# Patient Record
Sex: Female | Born: 1955 | Race: Black or African American | Hispanic: No
Health system: Southern US, Community
[De-identification: ages and names within clinical notes are randomized; demographics above are authoritative.]

## PROBLEM LIST (undated history)

## (undated) DIAGNOSIS — M1990 Unspecified osteoarthritis, unspecified site: Secondary | ICD-10-CM

## (undated) DIAGNOSIS — E782 Mixed hyperlipidemia: Secondary | ICD-10-CM

## (undated) DIAGNOSIS — I10 Essential (primary) hypertension: Secondary | ICD-10-CM

## (undated) HISTORY — DX: Unspecified osteoarthritis, unspecified site: M19.90

## (undated) HISTORY — DX: Essential (primary) hypertension: I10

## (undated) HISTORY — DX: Mixed hyperlipidemia: E78.2

---

## 2008-04-18 ENCOUNTER — Ambulatory Visit: Payer: Self-pay | Admitting: Physician Assistant

## 2008-05-07 ENCOUNTER — Ambulatory Visit: Payer: Self-pay | Admitting: Physician Assistant

## 2010-06-30 ENCOUNTER — Ambulatory Visit: Payer: Self-pay | Admitting: Family Medicine

## 2011-07-01 ENCOUNTER — Ambulatory Visit: Payer: Self-pay | Admitting: Family Medicine

## 2012-06-12 ENCOUNTER — Emergency Department: Payer: Self-pay | Admitting: Emergency Medicine

## 2014-09-19 ENCOUNTER — Other Ambulatory Visit (HOSPITAL_COMMUNITY)
Admission: RE | Admit: 2014-09-19 | Discharge: 2014-09-19 | Disposition: A | Discharge status: Home / Self Care | Payer: BC Managed Care – PPO | Source: Ambulatory Visit | Attending: Family Medicine | Admitting: Family Medicine

## 2014-09-19 DIAGNOSIS — Z01411 Encounter for gynecological examination (general) (routine) with abnormal findings: Secondary | ICD-10-CM | POA: Insufficient documentation

## 2014-09-19 DIAGNOSIS — Z1151 Encounter for screening for human papillomavirus (HPV): Secondary | ICD-10-CM | POA: Insufficient documentation

## 2014-09-20 ENCOUNTER — Other Ambulatory Visit: Payer: Self-pay | Admitting: Family Medicine

## 2014-09-24 LAB — CYTOLOGY - PAP

## 2014-10-17 ENCOUNTER — Other Ambulatory Visit (HOSPITAL_COMMUNITY): Payer: Self-pay | Admitting: Family Medicine

## 2014-10-17 DIAGNOSIS — Z1231 Encounter for screening mammogram for malignant neoplasm of breast: Secondary | ICD-10-CM

## 2014-10-24 ENCOUNTER — Ambulatory Visit (HOSPITAL_COMMUNITY): Payer: BC Managed Care – PPO

## 2018-12-08 ENCOUNTER — Other Ambulatory Visit (HOSPITAL_COMMUNITY): Payer: Self-pay | Admitting: Internal Medicine

## 2018-12-08 DIAGNOSIS — Z1231 Encounter for screening mammogram for malignant neoplasm of breast: Secondary | ICD-10-CM

## 2019-08-23 ENCOUNTER — Other Ambulatory Visit (HOSPITAL_COMMUNITY): Payer: Self-pay | Admitting: Internal Medicine

## 2019-08-23 DIAGNOSIS — Z1231 Encounter for screening mammogram for malignant neoplasm of breast: Secondary | ICD-10-CM

## 2019-08-30 ENCOUNTER — Other Ambulatory Visit: Payer: Self-pay

## 2019-08-30 ENCOUNTER — Ambulatory Visit (HOSPITAL_COMMUNITY)
Admission: RE | Admit: 2019-08-30 | Discharge: 2019-08-30 | Disposition: A | Discharge status: Home / Self Care | Payer: BC Managed Care – PPO | Source: Ambulatory Visit | Attending: Internal Medicine | Admitting: Internal Medicine

## 2019-08-30 DIAGNOSIS — Z1231 Encounter for screening mammogram for malignant neoplasm of breast: Secondary | ICD-10-CM | POA: Insufficient documentation

## 2019-08-31 ENCOUNTER — Other Ambulatory Visit (HOSPITAL_COMMUNITY): Payer: Self-pay | Admitting: Internal Medicine

## 2019-08-31 DIAGNOSIS — Z13820 Encounter for screening for osteoporosis: Secondary | ICD-10-CM

## 2019-09-05 ENCOUNTER — Ambulatory Visit (HOSPITAL_COMMUNITY): Payer: BC Managed Care – PPO

## 2019-12-19 ENCOUNTER — Other Ambulatory Visit: Payer: Self-pay

## 2019-12-19 ENCOUNTER — Ambulatory Visit (HOSPITAL_COMMUNITY)
Admission: RE | Admit: 2019-12-19 | Discharge: 2019-12-19 | Disposition: A | Discharge status: Home / Self Care | Payer: BC Managed Care – PPO | Source: Ambulatory Visit | Attending: Internal Medicine | Admitting: Internal Medicine

## 2019-12-19 DIAGNOSIS — Z13820 Encounter for screening for osteoporosis: Secondary | ICD-10-CM | POA: Diagnosis present

## 2020-09-07 IMAGING — MG DIGITAL SCREENING BILAT W/ TOMO W/ CAD
6 of 10 series · 6 of 30 positions shown · non-contrast
Comparison: Previous exam(s).

CLINICAL DATA: Screening.

EXAM:
DIGITAL SCREENING BILATERAL MAMMOGRAM WITH TOMO AND CAD

[L CC synth-2D]
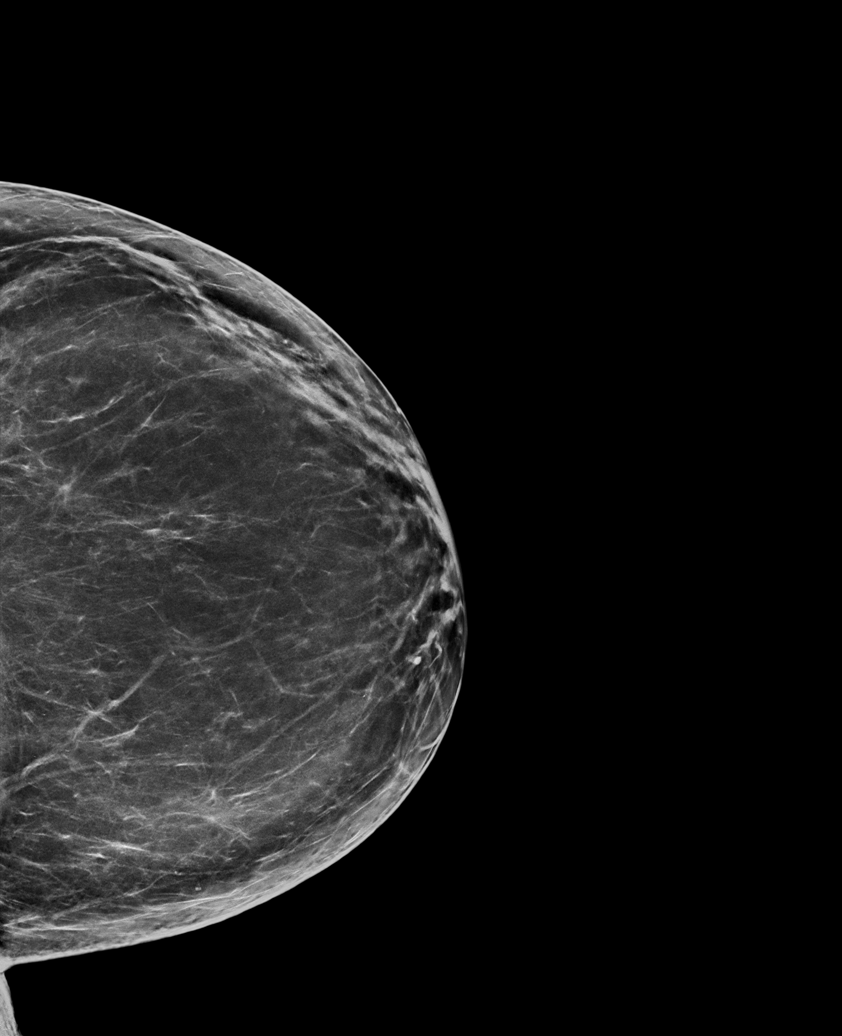

[R MLO synth-2D]
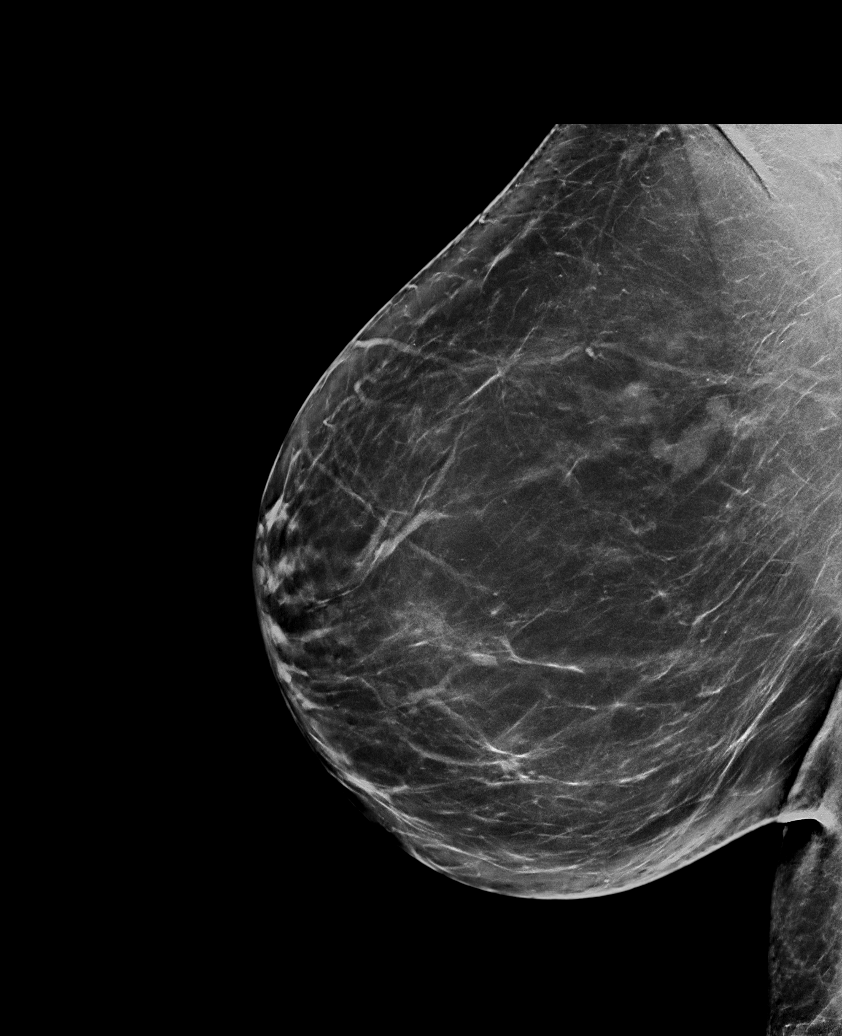

[L MLO synth-2D (1 of 2)]
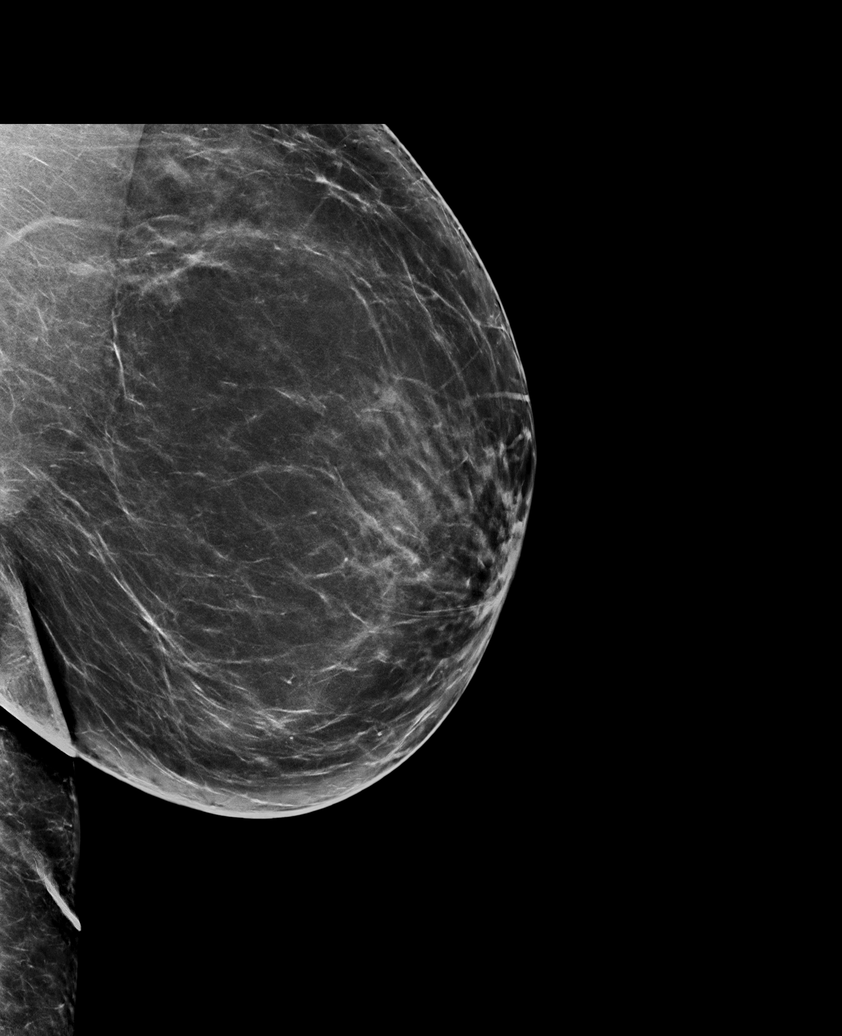

[R CC synth-2D]
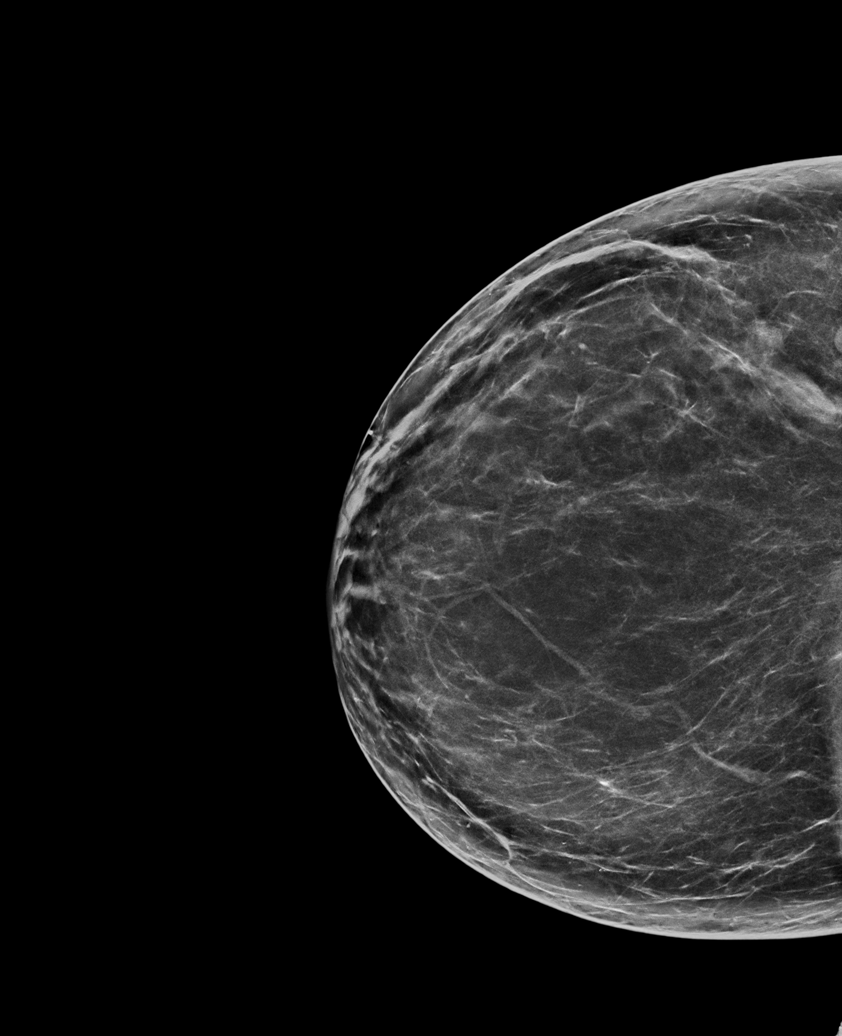

[L MLO synth-2D (2 of 2)]
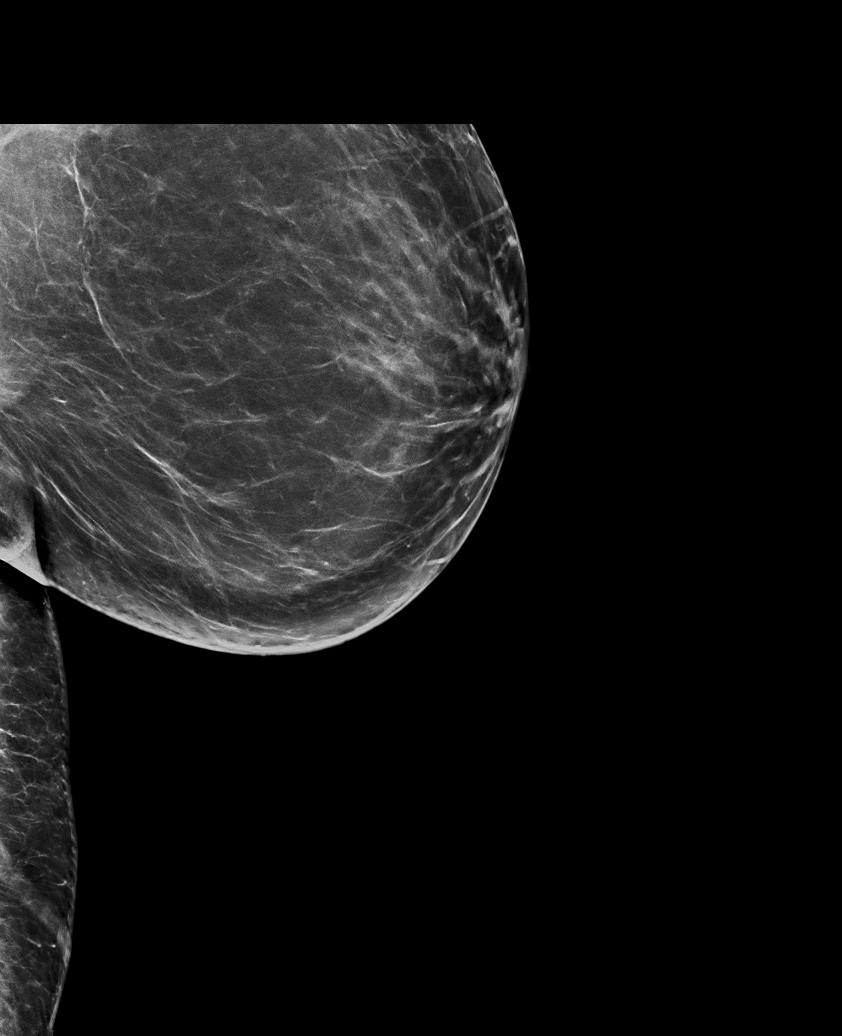

[R MLO tomo · tomo slice 45/90.0]
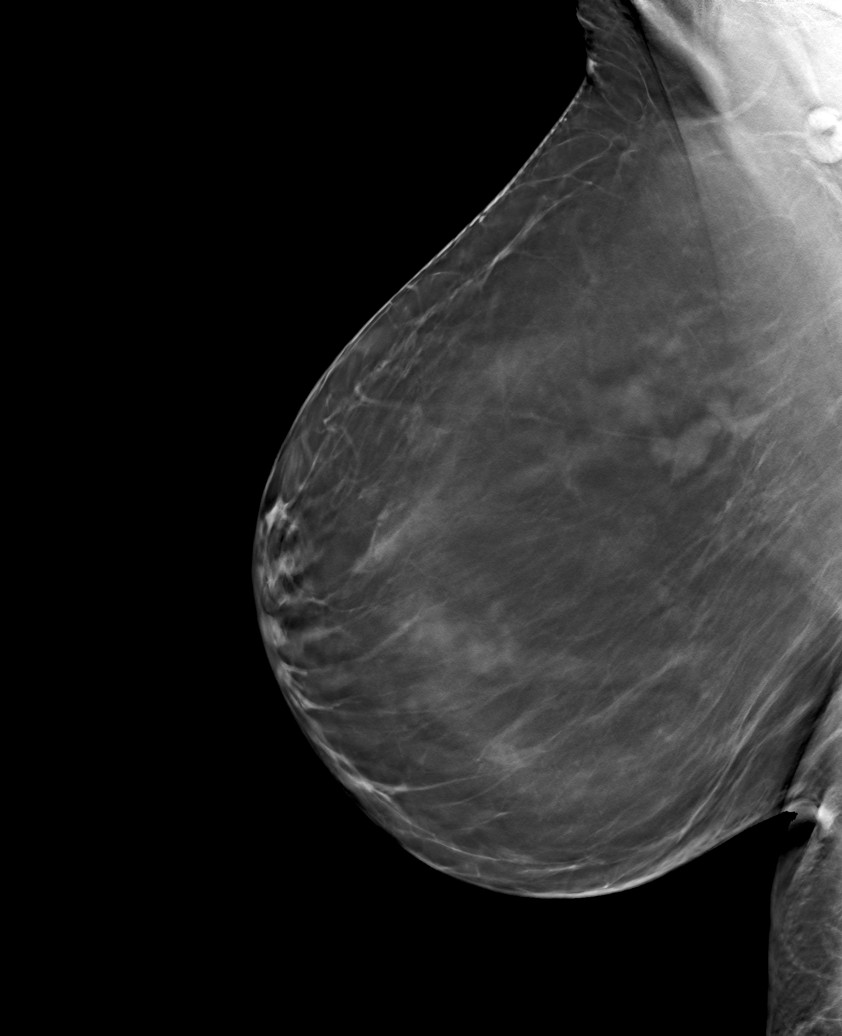

[6 of 30 positions shown; findings below may reference images not displayed]

ACR Breast Density Category b: There are scattered areas of
fibroglandular density.
FINDINGS: There are no findings suspicious for malignancy. Images were
processed with CAD.
IMPRESSION: No mammographic evidence of malignancy. A result letter of this
screening mammogram will be mailed directly to the patient.

RECOMMENDATION:
Screening mammogram in one year. (Code:CN-U-775)

BI-RADS CATEGORY  1: Negative.

## 2021-10-01 ENCOUNTER — Other Ambulatory Visit (HOSPITAL_COMMUNITY): Payer: Self-pay | Admitting: Family Medicine

## 2021-10-01 DIAGNOSIS — Z13820 Encounter for screening for osteoporosis: Secondary | ICD-10-CM

## 2021-10-01 DIAGNOSIS — Z1231 Encounter for screening mammogram for malignant neoplasm of breast: Secondary | ICD-10-CM

## 2021-11-02 ENCOUNTER — Encounter (HOSPITAL_COMMUNITY): Payer: Self-pay

## 2021-11-02 ENCOUNTER — Ambulatory Visit (HOSPITAL_COMMUNITY)
Admission: RE | Admit: 2021-11-02 | Discharge: 2021-11-02 | Disposition: A | Discharge status: Home / Self Care | Payer: Medicare PPO | Source: Ambulatory Visit | Attending: Family Medicine | Admitting: Family Medicine

## 2021-11-02 DIAGNOSIS — Z780 Asymptomatic menopausal state: Secondary | ICD-10-CM | POA: Insufficient documentation

## 2021-11-02 DIAGNOSIS — M85852 Other specified disorders of bone density and structure, left thigh: Secondary | ICD-10-CM | POA: Insufficient documentation

## 2021-11-02 DIAGNOSIS — Z1231 Encounter for screening mammogram for malignant neoplasm of breast: Secondary | ICD-10-CM | POA: Insufficient documentation

## 2021-11-02 DIAGNOSIS — Z13820 Encounter for screening for osteoporosis: Secondary | ICD-10-CM | POA: Diagnosis present

## 2022-01-05 DIAGNOSIS — R519 Headache, unspecified: Secondary | ICD-10-CM | POA: Diagnosis not present

## 2022-01-08 DIAGNOSIS — Z7984 Long term (current) use of oral hypoglycemic drugs: Secondary | ICD-10-CM | POA: Diagnosis not present

## 2022-01-08 DIAGNOSIS — R7303 Prediabetes: Secondary | ICD-10-CM | POA: Diagnosis not present

## 2022-01-08 DIAGNOSIS — M159 Polyosteoarthritis, unspecified: Secondary | ICD-10-CM | POA: Diagnosis not present

## 2022-01-08 DIAGNOSIS — M8580 Other specified disorders of bone density and structure, unspecified site: Secondary | ICD-10-CM | POA: Diagnosis not present

## 2022-01-08 DIAGNOSIS — E782 Mixed hyperlipidemia: Secondary | ICD-10-CM | POA: Diagnosis not present

## 2022-01-08 DIAGNOSIS — G4700 Insomnia, unspecified: Secondary | ICD-10-CM | POA: Diagnosis not present

## 2022-01-08 DIAGNOSIS — R635 Abnormal weight gain: Secondary | ICD-10-CM | POA: Diagnosis not present

## 2022-01-08 DIAGNOSIS — B351 Tinea unguium: Secondary | ICD-10-CM | POA: Diagnosis not present

## 2022-01-08 DIAGNOSIS — I10 Essential (primary) hypertension: Secondary | ICD-10-CM | POA: Diagnosis not present

## 2022-02-03 ENCOUNTER — Encounter: Payer: Self-pay | Admitting: *Deleted

## 2022-03-29 ENCOUNTER — Encounter: Payer: Self-pay | Admitting: *Deleted

## 2022-03-29 ENCOUNTER — Other Ambulatory Visit: Payer: Self-pay | Admitting: *Deleted

## 2022-03-30 ENCOUNTER — Ambulatory Visit: Payer: Medicare PPO | Attending: Internal Medicine | Admitting: Internal Medicine

## 2022-03-30 NOTE — Progress Notes (Signed)
Erroneous encounter. Please disregard.

## 2022-07-05 ENCOUNTER — Encounter: Payer: Self-pay | Admitting: *Deleted

## 2022-07-20 DIAGNOSIS — I10 Essential (primary) hypertension: Secondary | ICD-10-CM | POA: Diagnosis not present

## 2022-07-20 DIAGNOSIS — R7303 Prediabetes: Secondary | ICD-10-CM | POA: Diagnosis not present

## 2022-07-20 DIAGNOSIS — E782 Mixed hyperlipidemia: Secondary | ICD-10-CM | POA: Diagnosis not present

## 2022-07-27 DIAGNOSIS — Z79899 Other long term (current) drug therapy: Secondary | ICD-10-CM | POA: Diagnosis not present

## 2022-07-27 DIAGNOSIS — M159 Polyosteoarthritis, unspecified: Secondary | ICD-10-CM | POA: Diagnosis not present

## 2022-07-27 DIAGNOSIS — M8580 Other specified disorders of bone density and structure, unspecified site: Secondary | ICD-10-CM | POA: Diagnosis not present

## 2022-07-27 DIAGNOSIS — G4700 Insomnia, unspecified: Secondary | ICD-10-CM | POA: Diagnosis not present

## 2022-07-27 DIAGNOSIS — R7303 Prediabetes: Secondary | ICD-10-CM | POA: Diagnosis not present

## 2022-07-27 DIAGNOSIS — I10 Essential (primary) hypertension: Secondary | ICD-10-CM | POA: Diagnosis not present

## 2022-07-27 DIAGNOSIS — E782 Mixed hyperlipidemia: Secondary | ICD-10-CM | POA: Diagnosis not present

## 2022-07-27 DIAGNOSIS — Z7984 Long term (current) use of oral hypoglycemic drugs: Secondary | ICD-10-CM | POA: Diagnosis not present

## 2022-07-27 DIAGNOSIS — R635 Abnormal weight gain: Secondary | ICD-10-CM | POA: Diagnosis not present

## 2022-07-29 ENCOUNTER — Encounter: Payer: Self-pay | Admitting: *Deleted

## 2023-02-02 DIAGNOSIS — R635 Abnormal weight gain: Secondary | ICD-10-CM | POA: Diagnosis not present

## 2023-02-02 DIAGNOSIS — E782 Mixed hyperlipidemia: Secondary | ICD-10-CM | POA: Diagnosis not present

## 2023-02-02 DIAGNOSIS — R7301 Impaired fasting glucose: Secondary | ICD-10-CM | POA: Diagnosis not present

## 2023-02-02 DIAGNOSIS — I10 Essential (primary) hypertension: Secondary | ICD-10-CM | POA: Diagnosis not present

## 2023-02-03 ENCOUNTER — Encounter (INDEPENDENT_AMBULATORY_CARE_PROVIDER_SITE_OTHER): Payer: Self-pay | Admitting: *Deleted

## 2023-02-10 DIAGNOSIS — Z0001 Encounter for general adult medical examination with abnormal findings: Secondary | ICD-10-CM | POA: Diagnosis not present

## 2023-02-10 DIAGNOSIS — M159 Polyosteoarthritis, unspecified: Secondary | ICD-10-CM | POA: Diagnosis not present

## 2023-02-10 DIAGNOSIS — E782 Mixed hyperlipidemia: Secondary | ICD-10-CM | POA: Diagnosis not present

## 2023-02-10 DIAGNOSIS — R7303 Prediabetes: Secondary | ICD-10-CM | POA: Diagnosis not present

## 2023-02-10 DIAGNOSIS — R635 Abnormal weight gain: Secondary | ICD-10-CM | POA: Diagnosis not present

## 2023-02-10 DIAGNOSIS — Z23 Encounter for immunization: Secondary | ICD-10-CM | POA: Diagnosis not present

## 2023-02-10 DIAGNOSIS — I10 Essential (primary) hypertension: Secondary | ICD-10-CM | POA: Diagnosis not present

## 2023-02-10 DIAGNOSIS — G4700 Insomnia, unspecified: Secondary | ICD-10-CM | POA: Diagnosis not present

## 2023-02-10 DIAGNOSIS — Z0000 Encounter for general adult medical examination without abnormal findings: Secondary | ICD-10-CM | POA: Diagnosis not present

## 2023-03-08 DIAGNOSIS — H524 Presbyopia: Secondary | ICD-10-CM | POA: Diagnosis not present

## 2023-03-08 DIAGNOSIS — H2513 Age-related nuclear cataract, bilateral: Secondary | ICD-10-CM | POA: Diagnosis not present

## 2023-03-16 ENCOUNTER — Encounter: Payer: Self-pay | Admitting: *Deleted

## 2023-03-18 ENCOUNTER — Telehealth: Payer: Self-pay

## 2023-03-18 NOTE — Progress Notes (Signed)
   03/18/2023  Patient ID: Sharon Park, female   DOB: 1955/12/07, 68 y.o.   MRN: [id number]  Patient appeared on insurance report for not passing the quality metrics in 2024:  Medication Adherence for Cholesterol (MAC) Medication Adherence for Diabetes (MAD) Medication Adherence for Hypertension [facility])   Outreach to the patient was not needed today.   Meds to track:  -Lisinopril 5 mg - last fill 90DS on 05/28/22, no fills yet this year. Most recent BP 140/80 on 02/10/23.  -Metformin ER 500 mg - last fill 90DS on 05/28/22, appears to have been discontinued after PCP visit on 02/10/23. A1C has been stable in prediabetic range and was 6.2 on 02/10/23  -Rosuvastatin 5 mg - last fill 90DS on 05/28/22, no fills yet this year. LDL 86 at last visit on 02/10/23  Plan:  No need for outreach at the moment, patient does not qualify for adherence metric yet and conditions are relatively well managed. Next PCP visit 05/20/23, will schedule next adherence review/outreach in 1 month  Fayette Pho, PharmD

## 2023-04-21 ENCOUNTER — Telehealth: Payer: Self-pay

## 2023-04-21 NOTE — Progress Notes (Signed)
   04/21/2023  Patient ID: Ferrel Hsu, female   DOB: 1955/08/03, 68 y.o.   MRN: [id number]  Patient appeared on insurance report for not passing the quality metrics in 2024:  Medication Adherence for Cholesterol (MAC) Medication Adherence for Diabetes (MAD) Medication Adherence for Hypertension [facility] - )   Outreach to the patient was successful. Patient went to pick up medications last week and was told there were no refills. Had a follow up with labs in February this year.   Meds to track:  -Lisinopril 5 mg - last fill 90DS on 05/28/22, no fills yet this year. Most recent BP 140/80 on 02/10/23.  -Metformin ER 500 mg - last fill 90DS on 05/28/22, appears to have been discontinued after PCP visit on 02/10/23. A1C has been stable in prediabetic range and was 6.2 on 02/10/23  -Rosuvastatin 5 mg - last fill 90DS on 05/28/22, no fills yet this year. LDL 86 at last visit on 02/10/23  Plan:  Collaborated with PCP to send 100DS w/ 4 refills of the above medications to [facility] in [place]. I will schedule a chart review and fill history review on 05/23/23 to see if there are any changes to prescriptions after her updated labs/visit.    Flint Hummer, PharmD

## 2023-05-16 DIAGNOSIS — I10 Essential (primary) hypertension: Secondary | ICD-10-CM | POA: Diagnosis not present

## 2023-05-16 DIAGNOSIS — R7303 Prediabetes: Secondary | ICD-10-CM | POA: Diagnosis not present

## 2023-05-23 ENCOUNTER — Ambulatory Visit (HOSPITAL_COMMUNITY)
Admission: RE | Admit: 2023-05-23 | Discharge: 2023-05-23 | Disposition: A | Discharge status: Home / Self Care | Source: Ambulatory Visit | Attending: Nurse Practitioner | Admitting: Nurse Practitioner

## 2023-05-23 ENCOUNTER — Encounter (HOSPITAL_COMMUNITY): Payer: Self-pay

## 2023-05-23 ENCOUNTER — Other Ambulatory Visit (HOSPITAL_COMMUNITY): Payer: Self-pay | Admitting: Nurse Practitioner

## 2023-05-23 DIAGNOSIS — Z1231 Encounter for screening mammogram for malignant neoplasm of breast: Secondary | ICD-10-CM | POA: Insufficient documentation

## 2023-05-23 DIAGNOSIS — E1165 Type 2 diabetes mellitus with hyperglycemia: Secondary | ICD-10-CM | POA: Diagnosis not present

## 2023-05-23 DIAGNOSIS — M8580 Other specified disorders of bone density and structure, unspecified site: Secondary | ICD-10-CM | POA: Diagnosis not present

## 2023-05-23 DIAGNOSIS — R7303 Prediabetes: Secondary | ICD-10-CM | POA: Diagnosis not present

## 2023-05-23 DIAGNOSIS — E782 Mixed hyperlipidemia: Secondary | ICD-10-CM | POA: Diagnosis not present

## 2023-05-23 DIAGNOSIS — Z79899 Other long term (current) drug therapy: Secondary | ICD-10-CM | POA: Diagnosis not present

## 2023-05-23 DIAGNOSIS — M159 Polyosteoarthritis, unspecified: Secondary | ICD-10-CM | POA: Diagnosis not present

## 2023-05-23 DIAGNOSIS — K219 Gastro-esophageal reflux disease without esophagitis: Secondary | ICD-10-CM | POA: Diagnosis not present

## 2023-05-23 DIAGNOSIS — I10 Essential (primary) hypertension: Secondary | ICD-10-CM | POA: Diagnosis not present

## 2023-05-23 DIAGNOSIS — R635 Abnormal weight gain: Secondary | ICD-10-CM | POA: Diagnosis not present

## 2023-06-01 ENCOUNTER — Ambulatory Visit (HOSPITAL_COMMUNITY)

## 2023-07-25 ENCOUNTER — Telehealth: Payer: Self-pay

## 2023-07-25 NOTE — Telephone Encounter (Signed)
 Up to date on meds, first fill in June, due again in September. Will review fill history again in September

## 2023-08-24 DIAGNOSIS — R7303 Prediabetes: Secondary | ICD-10-CM | POA: Diagnosis not present

## 2023-08-24 DIAGNOSIS — I10 Essential (primary) hypertension: Secondary | ICD-10-CM | POA: Diagnosis not present

## 2023-09-08 ENCOUNTER — Other Ambulatory Visit (HOSPITAL_COMMUNITY): Payer: Self-pay | Admitting: Nurse Practitioner

## 2023-09-08 DIAGNOSIS — R635 Abnormal weight gain: Secondary | ICD-10-CM | POA: Diagnosis not present

## 2023-09-08 DIAGNOSIS — M8580 Other specified disorders of bone density and structure, unspecified site: Secondary | ICD-10-CM | POA: Diagnosis not present

## 2023-09-08 DIAGNOSIS — R7303 Prediabetes: Secondary | ICD-10-CM | POA: Diagnosis not present

## 2023-09-08 DIAGNOSIS — Z124 Encounter for screening for malignant neoplasm of cervix: Secondary | ICD-10-CM | POA: Diagnosis not present

## 2023-09-08 DIAGNOSIS — K219 Gastro-esophageal reflux disease without esophagitis: Secondary | ICD-10-CM | POA: Diagnosis not present

## 2023-09-08 DIAGNOSIS — E1165 Type 2 diabetes mellitus with hyperglycemia: Secondary | ICD-10-CM | POA: Diagnosis not present

## 2023-09-08 DIAGNOSIS — N888 Other specified noninflammatory disorders of cervix uteri: Secondary | ICD-10-CM | POA: Diagnosis not present

## 2023-09-08 DIAGNOSIS — M159 Polyosteoarthritis, unspecified: Secondary | ICD-10-CM | POA: Diagnosis not present

## 2023-09-08 DIAGNOSIS — I10 Essential (primary) hypertension: Secondary | ICD-10-CM | POA: Diagnosis not present

## 2023-09-08 DIAGNOSIS — E782 Mixed hyperlipidemia: Secondary | ICD-10-CM | POA: Diagnosis not present

## 2023-09-16 ENCOUNTER — Encounter (INDEPENDENT_AMBULATORY_CARE_PROVIDER_SITE_OTHER): Payer: Self-pay | Admitting: *Deleted

## 2023-12-28 ENCOUNTER — Encounter (INDEPENDENT_AMBULATORY_CARE_PROVIDER_SITE_OTHER): Payer: Self-pay | Admitting: *Deleted

## 2024-01-03 ENCOUNTER — Ambulatory Visit (HOSPITAL_COMMUNITY)
Admission: RE | Admit: 2024-01-03 | Discharge: 2024-01-03 | Disposition: A | Discharge status: Home / Self Care | Source: Ambulatory Visit | Attending: Nurse Practitioner | Admitting: Nurse Practitioner

## 2024-01-03 DIAGNOSIS — M8580 Other specified disorders of bone density and structure, unspecified site: Secondary | ICD-10-CM | POA: Diagnosis present

## 2024-01-03 DIAGNOSIS — M8589 Other specified disorders of bone density and structure, multiple sites: Secondary | ICD-10-CM | POA: Diagnosis not present

## 2024-02-03 ENCOUNTER — Ambulatory Visit: Attending: Internal Medicine | Admitting: Internal Medicine

## 2024-02-03 ENCOUNTER — Encounter: Payer: Self-pay | Admitting: Internal Medicine

## 2024-02-03 VITALS — BP 160/75 | HR 80 | Ht 61.0 in | Wt 136.0 lb

## 2024-02-03 DIAGNOSIS — Z8249 Family history of ischemic heart disease and other diseases of the circulatory system: Secondary | ICD-10-CM | POA: Diagnosis not present

## 2024-02-03 DIAGNOSIS — I10 Essential (primary) hypertension: Secondary | ICD-10-CM | POA: Diagnosis not present

## 2024-02-03 DIAGNOSIS — R6889 Other general symptoms and signs: Secondary | ICD-10-CM | POA: Insufficient documentation

## 2024-02-03 DIAGNOSIS — Z136 Encounter for screening for cardiovascular disorders: Secondary | ICD-10-CM

## 2024-02-03 DIAGNOSIS — E785 Hyperlipidemia, unspecified: Secondary | ICD-10-CM | POA: Diagnosis not present

## 2024-02-03 NOTE — Progress Notes (Signed)
 "   Cardiology Office Note  Date: 02/03/2024   ID: EUPHA LOBB, DOB 1955/10/31, MRN [id number]  PCP:  Shona Norleen PEDLAR, MD  Cardiologist:  Diannah SHAUNNA Maywood, MD Electrophysiologist:  None   History of Present Illness: Sharon Park is a 69 y.o. female known to have HTN, HLD was referred to cardiology clinic for evaluation of CAD.  Patient works at a rehab.  She forgot to take her blood pressure medication this morning.  BP today is 160 mmHg SBP.  Blood pressures at home range between 135 and 145 mmHg SBP.  She is currently taking lisinopril 5 mg (2 tabs) however sometimes she misses taking the additional lisinopril 5 mg dose.  Does not have any angina or DOE.  She reports having fatigue with exertional activities that she previously used to do with no issues.  She is not clear if this is from old age versus any cardiac etiology.  She wants to know.  No dizziness, syncope, leg swelling.  Past Medical History:  Diagnosis Date   Essential hypertension    Mixed hyperlipidemia    Osteoarthritis     History reviewed. No pertinent surgical history.  Current Outpatient Medications  Medication Sig Dispense Refill   acetaminophen (TYLENOL) 650 MG CR tablet Take 650 mg by mouth every 8 (eight) hours as needed for pain.     lisinopril (ZESTRIL) 10 MG tablet Take 10 mg by mouth daily.     metFORMIN (GLUCOPHAGE-XR) 500 MG 24 hr tablet Take 500 mg by mouth daily.     rosuvastatin (CRESTOR) 20 MG tablet Take 20 mg by mouth daily.     No current facility-administered medications for this visit.   Allergies:  Codeine   Social History: The patient  reports that she has never smoked. She has never used smokeless tobacco.   Family History: The patient's family history is not on file.   ROS:  Please see the history of present illness. Otherwise, complete review of systems is positive for none.  All other systems are reviewed and negative.   Physical Exam: VS:  BP (!) 160/75 (BP Location:  Right Arm, Cuff Size: Normal)   Pulse 80   Ht 5' 1 (1.549 m)   Wt 136 lb (61.7 kg)   SpO2 98%   BMI 25.70 kg/m , BMI Body mass index is 25.7 kg/m.  Wt Readings from Last 3 Encounters:  02/03/24 136 lb (61.7 kg)    General: Patient appears comfortable at rest. HEENT: Conjunctiva and lids normal, oropharynx clear with moist mucosa. Neck: Supple, no elevated JVP or carotid bruits, no thyromegaly. Lungs: Clear to auscultation, nonlabored breathing at rest. Cardiac: Regular rate and rhythm, no S3 or significant systolic murmur, no pericardial rub. Abdomen: Soft, nontender, no hepatomegaly, bowel sounds present, no guarding or rebound. Extremities: No pitting edema, distal pulses 2+. Skin: Warm and dry. Musculoskeletal: No kyphosis. Neuropsychiatric: Alert and oriented x3, affect grossly appropriate.  Recent Labwork: No results found for requested labs within last 365 days.  No results found for: CHOL, TRIG, HDL, CHOLHDL, VLDL, LDLCALC, LDLDIRECT   Assessment and Plan:  Exercise intolerance - Patient reports fatigue with exertional activities that she previously used to do.  She is unclear if this is from old age versus any cardiac etiology.  Obtain exercise Myoview.  Family history of CAD - Father had PCI in his 84s.  She is interested to undergo CT coronary calcium scoring test.  Will obtain CT CAC score.  HLD, unknown  values - Continue rosuvastatin 20 mg nightly.  Obtain LP(a).  HTN, poorly controlled - Currently on lisinopril 10 mg once daily, she takes 5 mg tabs x 2 but not every day.  Encouraged compliance.  Home blood pressures range between 135 and 145 mmHg SBP.  Goal BP less than 130 mmHg SBP.  She also reported her blood pressures were always controlled until she hit her 60s.  Will obtain renal Doppler to rule out renal artery stenosis.   45 minutes spent in reviewing prior medical records, reports, more than 3 labs, discussion and  documentation.   Medication Adjustments/Labs and Tests Ordered: Current medicines are reviewed at length with the patient today.  Concerns regarding medicines are outlined above.    Disposition:  Follow up 3 months (patient would like to schedule 30-month follow-up appointment to discuss results).  Signed, Leibish Mcgregor Priya Marcel Gary, MD, 02/03/2024 2:35 PM    [facility] [address], [place], [place] [address]  "

## 2024-02-07 ENCOUNTER — Other Ambulatory Visit: Payer: Self-pay

## 2024-02-07 DIAGNOSIS — I10 Essential (primary) hypertension: Secondary | ICD-10-CM

## 2024-02-15 ENCOUNTER — Encounter (HOSPITAL_COMMUNITY)

## 2024-02-15 ENCOUNTER — Other Ambulatory Visit (HOSPITAL_COMMUNITY)

## 2024-03-15 ENCOUNTER — Ambulatory Visit (HOSPITAL_COMMUNITY)

## 2024-05-08 ENCOUNTER — Ambulatory Visit: Admitting: Internal Medicine

## 2024-05-09 ENCOUNTER — Ambulatory Visit: Admitting: Internal Medicine
